# Patient Record
Sex: Female | Born: 1965 | Race: White | Hispanic: No | Smoking: Never smoker
Health system: Southern US, Community
[De-identification: ages and names within clinical notes are randomized; demographics above are authoritative.]

## PROBLEM LIST (undated history)

## (undated) DIAGNOSIS — K279 Peptic ulcer, site unspecified, unspecified as acute or chronic, without hemorrhage or perforation: Secondary | ICD-10-CM

## (undated) DIAGNOSIS — K311 Adult hypertrophic pyloric stenosis: Secondary | ICD-10-CM

## (undated) DIAGNOSIS — M1990 Unspecified osteoarthritis, unspecified site: Secondary | ICD-10-CM

## (undated) HISTORY — PX: CHOLECYSTECTOMY: SHX55

## (undated) HISTORY — PX: PORT A CATH REVISION: SHX6033

## (undated) HISTORY — PX: BREAST ENHANCEMENT SURGERY: SHX7

## (undated) HISTORY — PX: TUBAL LIGATION: SHX77

## (undated) HISTORY — DX: Unspecified osteoarthritis, unspecified site: M19.90

## (undated) HISTORY — PX: TONSILLECTOMY: SUR1361

## (undated) HISTORY — PX: PARTIAL HYSTERECTOMY: SHX80

## (undated) HISTORY — PX: MASTOIDECTOMY: SHX711

## (undated) HISTORY — DX: Peptic ulcer, site unspecified, unspecified as acute or chronic, without hemorrhage or perforation: K27.9

## (undated) HISTORY — DX: Adult hypertrophic pyloric stenosis: K31.1

## (undated) HISTORY — PX: TYMPANOSTOMY: SHX2586

---

## 2015-01-31 ENCOUNTER — Encounter (INDEPENDENT_AMBULATORY_CARE_PROVIDER_SITE_OTHER): Payer: Self-pay | Admitting: *Deleted

## 2015-02-27 ENCOUNTER — Ambulatory Visit (INDEPENDENT_AMBULATORY_CARE_PROVIDER_SITE_OTHER): Payer: Medicare Other | Admitting: Internal Medicine

## 2015-03-09 ENCOUNTER — Encounter (INDEPENDENT_AMBULATORY_CARE_PROVIDER_SITE_OTHER): Payer: Self-pay | Admitting: *Deleted

## 2015-03-09 ENCOUNTER — Ambulatory Visit (INDEPENDENT_AMBULATORY_CARE_PROVIDER_SITE_OTHER): Payer: Medicare Other | Admitting: Internal Medicine

## 2015-03-09 ENCOUNTER — Encounter (INDEPENDENT_AMBULATORY_CARE_PROVIDER_SITE_OTHER): Payer: Self-pay | Admitting: Internal Medicine

## 2015-03-09 VITALS — BP 112/72 | HR 64 | Temp 97.9°F | Ht 66.0 in | Wt 139.4 lb

## 2015-03-09 DIAGNOSIS — H8103 Meniere's disease, bilateral: Secondary | ICD-10-CM

## 2015-03-09 DIAGNOSIS — K5000 Crohn's disease of small intestine without complications: Secondary | ICD-10-CM | POA: Diagnosis not present

## 2015-03-09 DIAGNOSIS — I776 Arteritis, unspecified: Secondary | ICD-10-CM

## 2015-03-09 DIAGNOSIS — D509 Iron deficiency anemia, unspecified: Secondary | ICD-10-CM

## 2015-03-09 DIAGNOSIS — K5090 Crohn's disease, unspecified, without complications: Secondary | ICD-10-CM | POA: Insufficient documentation

## 2015-03-09 DIAGNOSIS — I951 Orthostatic hypotension: Secondary | ICD-10-CM | POA: Diagnosis not present

## 2015-03-09 DIAGNOSIS — H8109 Meniere's disease, unspecified ear: Secondary | ICD-10-CM | POA: Insufficient documentation

## 2015-03-09 DIAGNOSIS — M359 Systemic involvement of connective tissue, unspecified: Secondary | ICD-10-CM | POA: Insufficient documentation

## 2015-03-09 DIAGNOSIS — D649 Anemia, unspecified: Secondary | ICD-10-CM | POA: Insufficient documentation

## 2015-03-09 DIAGNOSIS — M1990 Unspecified osteoarthritis, unspecified site: Secondary | ICD-10-CM

## 2015-03-09 DIAGNOSIS — K3184 Gastroparesis: Secondary | ICD-10-CM

## 2015-03-09 LAB — CBC WITH DIFFERENTIAL/PLATELET
BASOS PCT: 0 % (ref 0–1)
Basophils Absolute: 0 10*3/uL (ref 0.0–0.1)
EOS ABS: 0.1 10*3/uL (ref 0.0–0.7)
EOS PCT: 1 % (ref 0–5)
HCT: 34 % — ABNORMAL LOW (ref 36.0–46.0)
Hemoglobin: 11.5 g/dL — ABNORMAL LOW (ref 12.0–15.0)
LYMPHS ABS: 2.6 10*3/uL (ref 0.7–4.0)
Lymphocytes Relative: 47 % — ABNORMAL HIGH (ref 12–46)
MCH: 30.1 pg (ref 26.0–34.0)
MCHC: 33.8 g/dL (ref 30.0–36.0)
MCV: 89 fL (ref 78.0–100.0)
MPV: 9.4 fL (ref 8.6–12.4)
Monocytes Absolute: 0.5 10*3/uL (ref 0.1–1.0)
Monocytes Relative: 9 % (ref 3–12)
NEUTROS PCT: 43 % (ref 43–77)
Neutro Abs: 2.4 10*3/uL (ref 1.7–7.7)
PLATELETS: 285 10*3/uL (ref 150–400)
RBC: 3.82 MIL/uL — ABNORMAL LOW (ref 3.87–5.11)
RDW: 13 % (ref 11.5–15.5)
WBC: 5.5 10*3/uL (ref 4.0–10.5)

## 2015-03-09 LAB — C-REACTIVE PROTEIN

## 2015-03-09 LAB — HEPATIC FUNCTION PANEL
ALK PHOS: 74 U/L (ref 33–115)
ALT: 7 U/L (ref 6–29)
AST: 13 U/L (ref 10–35)
Albumin: 3.7 g/dL (ref 3.6–5.1)
BILIRUBIN DIRECT: 0.1 mg/dL (ref <=0.2)
BILIRUBIN TOTAL: 0.4 mg/dL (ref 0.2–1.2)
Indirect Bilirubin: 0.3 mg/dL (ref 0.2–1.2)
Total Protein: 6.1 g/dL (ref 6.1–8.1)

## 2015-03-09 NOTE — Progress Notes (Signed)
Subjective:    Patient ID: Katherine Richards, female    DOB: 06-10-1965, 50 y.o.   MRN: [id number]  HPI Referred by Dr. Sherryll Burger for hx of Crohn's disease. She saw Dr. Sherryll Burger as a new patient 01/29/2015. She was diagnosed In 2008 with capsule endoscopy. She is taking Entocort 6-9mg  a day. She has taken Pentasa in the past. She averages 1-2 BMs a day. Sometimes she will have diarrhea when she has vertigo. When she has diarrhea, she says she passes a lot of "grease" She denies any BRRB or melena. Her last colonoscopy October 2016 in [place]. ( I am going to try to get these records) For the most part with her Crohn's she is doing well. She says her appetite is not good. She takes Reglan for gastroparesis.  She has a Port a cath for Meniere's and diarrhea. (IV fluids) She has home heath once a week to give her IV fluids. For the most part her acid reflux is controlled with Prevacid.  No side effects from the Reglan. She has been on Reglan since 2015.      Review of Systems Past Medical History  Diagnosis Date  . DJD (degenerative joint disease)   . Pyloric stenosis   . PUD (peptic ulcer disease)     Past Surgical History  Procedure Laterality Date  . Tonsillectomy    . Mastoidectomy    . Tympanostomy      x 6  . Breast enhancement surgery    . Port a cath revision      x 4  . Tubal ligation    . Partial hysterectomy    . Cholecystectomy      Allergies  Allergen Reactions  . Amoxicillin   . Augmentin [Amoxicillin-Pot Clavulanate]   . Ceclor [Cefaclor]   . Cefepime   . Flagyl [Metronidazole]     Swelling and hives  . Meperidine And Related     No current outpatient prescriptions on file prior to visit.   No current facility-administered medications on file prior to visit.   Current Outpatient Prescriptions  Medication Sig Dispense Refill  . acetaminophen (TYLENOL) 500 MG tablet Take 500 mg by mouth every 6 (six) hours as needed.    . budesonide (ENTOCORT EC) 3 MG 24  hr capsule Take 9 mg by mouth as needed.    . cholecalciferol (VITAMIN D) 1000 units tablet Take 6,000 Units by mouth daily.    . cyclobenzaprine (FLEXERIL) 10 MG tablet Take 10 mg by mouth 3 (three) times daily as needed for muscle spasms.    . diazepam (VALIUM) 10 MG tablet Take 10 mg by mouth every 6 (six) hours as needed for anxiety.    . diphenoxylate-atropine (LOMOTIL) 2.5-0.025 MG tablet Take by mouth 4 (four) times daily as needed for diarrhea or loose stools.    Marland Kitchen escitalopram (LEXAPRO) 20 MG tablet Take 20 mg by mouth daily.    . Ferrous Sulfate Dried (SLOW IRON PO) Take by mouth as needed.    . Homeopathic Products (ZICAM COLD REMEDY NA) Place into the nose as needed.    . hyoscyamine (LEVSIN, ANASPAZ) 0.125 MG tablet Take 0.125 mg by mouth every 4 (four) hours as needed.    . lansoprazole (PREVACID) 30 MG capsule Take 30 mg by mouth daily at 12 noon.    . Lido-Capsaicin-Men-Methyl Sal (MEDI-PATCH-LIDOCAINE) 0.5-0.035-5-20 % PTCH Apply topically.    Marland Kitchen loperamide (IMODIUM) 2 MG capsule Take by mouth as needed for diarrhea or  loose stools.    . metoCLOPramide (REGLAN) 5 MG tablet Take 5 mg by mouth as needed for nausea.    . midodrine (PROAMATINE) 2.5 MG tablet Take 2.5 mg by mouth 3 (three) times daily with meals.    . ondansetron (ZOFRAN) 8 MG tablet Take by mouth every 8 (eight) hours as needed for nausea or vomiting.    Marland Kitchen oxyCODONE-acetaminophen (ROXICET) 5-325 MG/5ML solution Take by mouth every 4 (four) hours as needed for severe pain.    . pindolol (VISKEN) 5 MG tablet Take 5 mg by mouth 2 (two) times daily.    . promethazine (PHENERGAN) 25 MG tablet Take 25 mg by mouth every 6 (six) hours as needed for nausea or vomiting.    . rizatriptan (MAXALT) 10 MG tablet Take 10 mg by mouth as needed for migraine. May repeat in 2 hours if needed    . zolpidem (AMBIEN) 10 MG tablet Take 10 mg by mouth at bedtime as needed for sleep.     No current facility-administered medications for this  visit.        Objective:   Physical Exam Blood pressure 112/72, pulse 64, temperature 97.9 F (36.6 C), height  (1.676 m), weight 139 lb 6.4 oz (63.231 kg). Alert and oriented. Skin warm and dry. Oral mucosa is moist.   . Sclera anicteric, conjunctivae is pink. Thyroid not enlarged. No cervical lymphadenopathy. Lungs clear. Heart regular rate and rhythm.  Abdomen is soft. Bowel sounds are positive. No hepatomegaly. No abdominal masses felt. No tenderness.  No edema to lower extremities.          Assessment & Plan:  Gastroparesis: She needs to take the Reglan 3 times a day. Eat small frequent meals. Crohn's disease. I need to get records before I start her on pentasa. Presently taking Entocort.  CBC, Hepatic function, CRP.  OV in 6 months.

## 2015-03-09 NOTE — Patient Instructions (Signed)
OV in 6 months. 

## 2015-03-19 ENCOUNTER — Encounter (INDEPENDENT_AMBULATORY_CARE_PROVIDER_SITE_OTHER): Payer: Self-pay | Admitting: *Deleted

## 2015-03-19 NOTE — Telephone Encounter (Signed)
This encounter was created in error - please disregard.

## 2015-03-22 ENCOUNTER — Ambulatory Visit (HOSPITAL_COMMUNITY): Payer: Medicare Other

## 2015-04-17 ENCOUNTER — Ambulatory Visit (HOSPITAL_COMMUNITY)
Admission: RE | Admit: 2015-04-17 | Discharge: 2015-04-17 | Disposition: A | Discharge status: Home / Self Care | Payer: Medicare Other | Source: Ambulatory Visit | Attending: Internal Medicine | Admitting: Internal Medicine

## 2015-04-17 DIAGNOSIS — K5000 Crohn's disease of small intestine without complications: Secondary | ICD-10-CM | POA: Diagnosis not present

## 2015-04-17 MED ORDER — HEPARIN SOD (PORK) LOCK FLUSH 100 UNIT/ML IV SOLN
INTRAVENOUS | Status: AC
Start: 2015-04-17 — End: 2015-04-18
  Filled 2015-04-17: qty 5

## 2015-04-17 MED ORDER — SODIUM CHLORIDE 0.9% FLUSH
INTRAVENOUS | Status: AC
Start: 2015-04-17 — End: 2015-04-18
  Filled 2015-04-17: qty 200

## 2015-04-17 MED ORDER — IOPAMIDOL (ISOVUE-300) INJECTION 61%
75.0000 mL | Freq: Once | INTRAVENOUS | Status: AC | PRN
  Administered 2015-04-17: 75 mL via INTRAVENOUS

## 2015-04-25 ENCOUNTER — Encounter (HOSPITAL_COMMUNITY)
Admission: RE | Admit: 2015-04-25 | Discharge: 2015-04-25 | Disposition: A | Discharge status: Home / Self Care | Payer: Medicare Other | Source: Ambulatory Visit | Attending: Internal Medicine | Admitting: Internal Medicine

## 2015-04-25 DIAGNOSIS — Z029 Encounter for administrative examinations, unspecified: Secondary | ICD-10-CM | POA: Diagnosis present

## 2015-04-25 MED ORDER — ALTEPLASE 100 MG IV SOLR
2.0000 mg | Freq: Once | INTRAVENOUS | Status: AC
  Administered 2015-04-25: 2 mg
  Filled 2015-04-25: qty 2

## 2015-04-25 MED ORDER — STERILE WATER FOR INJECTION IJ SOLN
10.0000 mL | Freq: Once | INTRAMUSCULAR | Status: AC
  Administered 2015-04-25: 10 mL via INTRAMUSCULAR

## 2015-06-04 ENCOUNTER — Encounter: Payer: Self-pay | Admitting: *Deleted

## 2015-06-04 ENCOUNTER — Encounter (INDEPENDENT_AMBULATORY_CARE_PROVIDER_SITE_OTHER): Payer: Self-pay

## 2015-06-06 ENCOUNTER — Ambulatory Visit: Payer: Medicare Other | Admitting: Cardiology

## 2015-06-14 ENCOUNTER — Encounter: Payer: Self-pay | Admitting: *Deleted

## 2015-06-15 ENCOUNTER — Encounter: Payer: Self-pay | Admitting: Cardiovascular Disease

## 2015-06-15 ENCOUNTER — Ambulatory Visit (INDEPENDENT_AMBULATORY_CARE_PROVIDER_SITE_OTHER): Payer: Medicare Other | Admitting: Cardiovascular Disease

## 2015-06-15 VITALS — BP 109/74 | HR 71 | Ht 66.0 in | Wt 141.0 lb

## 2015-06-15 DIAGNOSIS — R000 Tachycardia, unspecified: Secondary | ICD-10-CM | POA: Diagnosis not present

## 2015-06-15 DIAGNOSIS — I951 Orthostatic hypotension: Secondary | ICD-10-CM | POA: Diagnosis not present

## 2015-06-15 DIAGNOSIS — G90A Postural orthostatic tachycardia syndrome (POTS): Secondary | ICD-10-CM

## 2015-06-15 NOTE — Progress Notes (Signed)
Patient ID: Katherine Richards, female   DOB: 18-Mar-1965, 50 y.o.   MRN: [id number]       CARDIOLOGY CONSULT NOTE  Patient ID: Katherine Richards MRN: [id number] DOB/AGE: April 11, 1965 50 y.o.  Admit date: (Not on file) Primary Physician: Kirstie Peri, MD Referring Physician: Sherryll Burger MD  Reason for Consultation: Palpitations, POTS  HPI: The patient is a 50 year old woman with a history of Crohn's disease and GERD who is referred for the evaluation of palpitations in the setting of postural orthostatic tachycardia syndrome.  She and her boyfriend moved from [place], [place], to [place], [place], in December 2016. She was diagnosed with POTS in 2007.  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  She has previously been evaluated at [facility] and the [facility] in their dysautonomia clinic. She was diagnosed with low blood volume. She has bilateral Mnire's disease and sees a specialist at [facility]. She has bouts of aggressive vertigo. Because fludrocortisone is a sodium retainer, she is unable to take this as it makes her symptoms worse.  She has a Port-A-Cath and takes anywhere between 2-5 L of D5 half normal saline every week. Her symptoms are exacerbated when temperatures are greater than 75. She used to see an electrophysiologist in [place], [place]. Her systolic blood pressure ranges anywhere between 90-130.  She used to deal with ankle and feet edema but not recently. Has occasional feet cramps. She feels better when the air conditioning is on. Her mother and grandmother live in [place].  She has been hospitalized for sepsis on 4 different occasions, once with Klebsiella and at least twice with Escherichia coli. She was bedridden for several months.  She also has gastroparesis and also has an Field seismologist.   She currently takes pindolol 2.5 mg twice daily and midodrine 2.5 mg twice  daily.   She had been under a lot of stress as she was undergoing a divorce in the past 2 years. She has a home health nurse who comes in once a week and checks her Port-A-Cath.    Allergies  Allergen Reactions  . Other Anaphylaxis    DOWN FEATHERS  . Codeine Nausea And Vomiting  . Cymbalta [Duloxetine Hcl] Other (See Comments)    Suicidal ideation  . Erythromycin     Gastro-intestinal distress  . Flagyl [Metronidazole]     Swelling and hives  . Gabapentin Other (See Comments)    hallucinations  . Lyrica [Pregabalin] Other (See Comments)    Central edema  . Meperidine And Related   . Amoxicillin Rash  . Augmentin [Amoxicillin-Pot Clavulanate] Rash  . Ceclor [Cefaclor] Rash  . Cefepime Rash  . Demerol [Meperidine] Swelling and Rash  . Tape Rash    Current Outpatient Prescriptions  Medication Sig Dispense Refill  . acetaminophen (TYLENOL) 500 MG tablet Take 500 mg by mouth every 6 (six) hours as needed.    . budesonide (ENTOCORT EC) 3 MG 24 hr capsule Take 9 mg by mouth as needed.    . diazepam (VALIUM) 10 MG tablet Take 10 mg by mouth 2 (two) times daily.     . diphenoxylate-atropine (LOMOTIL) 2.5-0.025 MG tablet Take by mouth 4 (four) times daily as needed for diarrhea or loose stools.    Marland Kitchen EPINEPHrine (EPIPEN 2-PAK) 0.3 mg/0.3 mL IJ SOAJ injection Inject 0.3 mg into the muscle as needed.    Marland Kitchen escitalopram (LEXAPRO) 20 MG tablet Take 20  mg by mouth daily.    . Ferrous Sulfate Dried (SLOW IRON PO) Take by mouth as needed.    . hyoscyamine (LEVSIN, ANASPAZ) 0.125 MG tablet Take 0.125 mg by mouth 2 (two) times daily.     Marland Kitchen ibuprofen (ADVIL) 200 MG tablet Take 200 mg by mouth every 6 (six) hours as needed.    Marland Kitchen levocetirizine (XYZAL) 5 MG tablet Take 1 tablet by mouth daily as needed.    . loperamide (IMODIUM) 2 MG capsule Take by mouth as needed for diarrhea or loose stools.    . Melatonin ([employer] MELATONIN) 5 MG TABS Take 0.5-1 tablets by mouth daily.    . metoCLOPramide  (REGLAN) 5 MG tablet Take 10 mg by mouth every 6 (six) hours as needed for nausea.     . midodrine (PROAMATINE) 2.5 MG tablet Take 2.5 mg by mouth. 2-3 times daily    . omeprazole (PRILOSEC) 40 MG capsule Take 1 capsule by mouth daily.    . ondansetron (ZOFRAN) 8 MG tablet Take 8 mg by mouth every 8 (eight) hours as needed for nausea or vomiting.     . pindolol (VISKEN) 5 MG tablet Take 5 mg by mouth 2 (two) times daily.    . promethazine (PHENERGAN) 25 MG tablet Take 25 mg by mouth every 4 (four) hours as needed for nausea or vomiting.     Marland Kitchen zolpidem (AMBIEN) 10 MG tablet Take 5 mg by mouth at bedtime as needed for sleep.     . cholecalciferol (VITAMIN D) 1000 units tablet Take 6,000 Units by mouth daily.    . cyclobenzaprine (FLEXERIL) 10 MG tablet Take 10 mg by mouth 3 (three) times daily as needed for muscle spasms.    . Homeopathic Products (ZICAM COLD REMEDY NA) Place into the nose as needed.    . lansoprazole (PREVACID) 30 MG capsule Take 30 mg by mouth daily at 12 noon.    . Lido-Capsaicin-Men-Methyl Sal (MEDI-PATCH-LIDOCAINE) 0.5-0.035-5-20 % PTCH Apply topically.    . rizatriptan (MAXALT) 10 MG tablet Take 10 mg by mouth as needed for migraine. May repeat in 2 hours if needed     No current facility-administered medications for this visit.    Past Medical History  Diagnosis Date  . DJD (degenerative joint disease)   . Pyloric stenosis   . PUD (peptic ulcer disease)     Past Surgical History  Procedure Laterality Date  . Tonsillectomy    . Mastoidectomy    . Tympanostomy      x 6  . Breast enhancement surgery    . Port a cath revision      x 4  . Tubal ligation    . Partial hysterectomy    . Cholecystectomy      Social History   Social History  . Marital Status: Legally Separated    Spouse Name: N/A  . Number of Children: N/A  . Years of Education: N/A   Occupational History  . Not on file.   Social History Main Topics  . Smoking status: Never Smoker   .  Smokeless tobacco: Never Used  . Alcohol Use: No  . Drug Use: No  . Sexual Activity: Not on file   Other Topics Concern  . Not on file   Social History Narrative     No family history of premature CAD in 1st degree relatives.  Prior to Admission medications   Medication Sig Start Date End Date Taking? Authorizing Provider  acetaminophen (TYLENOL) 500  MG tablet Take 500 mg by mouth every 6 (six) hours as needed.    Historical Provider, MD  budesonide (ENTOCORT EC) 3 MG 24 hr capsule Take 9 mg by mouth as needed.    Historical Provider, MD  cholecalciferol (VITAMIN D) 1000 units tablet Take 6,000 Units by mouth daily.    Historical Provider, MD  cyclobenzaprine (FLEXERIL) 10 MG tablet Take 10 mg by mouth 3 (three) times daily as needed for muscle spasms.    Historical Provider, MD  diazepam (VALIUM) 10 MG tablet Take 10 mg by mouth every 6 (six) hours as needed for anxiety.    Historical Provider, MD  diphenoxylate-atropine (LOMOTIL) 2.5-0.025 MG tablet Take by mouth 4 (four) times daily as needed for diarrhea or loose stools.    Historical Provider, MD  escitalopram (LEXAPRO) 20 MG tablet Take 20 mg by mouth daily.    Historical Provider, MD  Ferrous Sulfate Dried (SLOW IRON PO) Take by mouth as needed.    Historical Provider, MD  Homeopathic Products [facility] COLD REMEDY NA) Place into the nose as needed.    Historical Provider, MD  hyoscyamine (LEVSIN, ANASPAZ) 0.125 MG tablet Take 0.125 mg by mouth every 4 (four) hours as needed.    Historical Provider, MD  lansoprazole (PREVACID) 30 MG capsule Take 30 mg by mouth daily at 12 noon.    Historical Provider, MD  Lido-Capsaicin-Men-Methyl Sal (MEDI-PATCH-LIDOCAINE) 0.5-0.035-5-20 % PTCH Apply topically.    Historical Provider, MD  loperamide (IMODIUM) 2 MG capsule Take by mouth as needed for diarrhea or loose stools.    Historical Provider, MD  metoCLOPramide (REGLAN) 5 MG tablet Take 5 mg by mouth as needed for nausea.    Historical  Provider, MD  midodrine (PROAMATINE) 2.5 MG tablet Take 2.5 mg by mouth 3 (three) times daily with meals.    Historical Provider, MD  ondansetron (ZOFRAN) 8 MG tablet Take by mouth every 8 (eight) hours as needed for nausea or vomiting.    Historical Provider, MD  oxyCODONE-acetaminophen (ROXICET) 5-325 MG/5ML solution Take by mouth every 4 (four) hours as needed for severe pain.    Historical Provider, MD  pindolol (VISKEN) 5 MG tablet Take 5 mg by mouth 2 (two) times daily.    Historical Provider, MD  promethazine (PHENERGAN) 25 MG tablet Take 25 mg by mouth every 6 (six) hours as needed for nausea or vomiting.    Historical Provider, MD  rizatriptan (MAXALT) 10 MG tablet Take 10 mg by mouth as needed for migraine. May repeat in 2 hours if needed    Historical Provider, MD  zolpidem (AMBIEN) 10 MG tablet Take 10 mg by mouth at bedtime as needed for sleep.    Historical Provider, MD     Review of systems complete and found to be negative unless listed above in HPI     Physical exam Blood pressure 109/74, pulse 71, height  (1.676 m), weight 141 lb (63.957 kg). General: NAD Neck: No JVD, no thyromegaly or thyroid nodule.  Lungs: Clear to auscultation bilaterally with normal respiratory effort. CV: Nondisplaced PMI. Regular rate and rhythm, normal S1/S2, no S3/S4, no murmur.  No peripheral edema.  No carotid bruit.  Normal pedal pulses.  Abdomen: Soft, nontender, no hepatosplenomegaly, no distention.  Skin: Intact without lesions or rashes.  Neurologic: Alert and oriented x 3.  Psych: Normal affect. Extremities: No clubbing or cyanosis.  HEENT: Normal.   ECG: Most recent ECG reviewed.  Labs:   Lab Results  Component  Value Date   WBC 5.5 03/09/2015   HGB 11.5* 03/09/2015   HCT 34.0* 03/09/2015   MCV 89.0 03/09/2015   PLT 285 03/09/2015   No results for input(s): NA, K, CL, CO2, BUN, CREATININE, CALCIUM, PROT, BILITOT, ALKPHOS, ALT, AST, GLUCOSE in the last 168  hours.  Invalid input(s): LABALBU No results found for: CKTOTAL, CKMB, CKMBINDEX, TROPONINI No results found for: CHOL No results found for: HDL No results found for: LDLCALC No results found for: TRIG No results found for: CHOLHDL No results found for: LDLDIRECT       Studies: No results found.  ASSESSMENT AND PLAN:  1. Postural orthostatic tachycardia syndrome: She is currently symptomatically stable. As stated above, she takes 2-5 L of D5 half normal saline every week. She currently takes 2.5 mg twice daily of midodrine and 2.5 mg twice daily of pindolol. She is symptomatically stable. I will make no adjustments to her medications at this time. We had a very extensive discussion regarding her past medical history and her multiple autoimmune diseases.   Dispo: fu 3 months.  Signed: Prentice Docker, M.D., F.A.C.C.  06/15/2015, 9:29 AM

## 2015-06-15 NOTE — Patient Instructions (Signed)
Your physician recommends that you schedule a follow-up appointment in: 3 months with Dr. Koneswaran  Your physician recommends that you continue on your current medications as directed. Please refer to the Current Medication list given to you today.  Thank you for choosing [facility]!    

## 2015-07-09 ENCOUNTER — Encounter (HOSPITAL_COMMUNITY)
Admission: RE | Admit: 2015-07-09 | Discharge: 2015-07-09 | Disposition: A | Discharge status: Home / Self Care | Payer: Medicare Other | Source: Ambulatory Visit | Attending: Internal Medicine | Admitting: Internal Medicine

## 2015-07-09 DIAGNOSIS — H8103 Meniere's disease, bilateral: Secondary | ICD-10-CM | POA: Diagnosis present

## 2015-07-09 DIAGNOSIS — M359 Systemic involvement of connective tissue, unspecified: Secondary | ICD-10-CM | POA: Diagnosis not present

## 2015-07-09 DIAGNOSIS — I776 Arteritis, unspecified: Secondary | ICD-10-CM | POA: Diagnosis not present

## 2015-07-09 DIAGNOSIS — K5000 Crohn's disease of small intestine without complications: Secondary | ICD-10-CM | POA: Insufficient documentation

## 2015-07-09 DIAGNOSIS — D509 Iron deficiency anemia, unspecified: Secondary | ICD-10-CM | POA: Insufficient documentation

## 2015-07-09 DIAGNOSIS — K3184 Gastroparesis: Secondary | ICD-10-CM | POA: Diagnosis not present

## 2015-07-09 DIAGNOSIS — I951 Orthostatic hypotension: Secondary | ICD-10-CM | POA: Diagnosis present

## 2015-07-09 MED ORDER — ALTEPLASE 2 MG IJ SOLR
INTRAMUSCULAR | Status: AC
  Filled 2015-07-09: qty 2

## 2015-07-09 MED ORDER — ALTEPLASE 2 MG IJ SOLR
2.0000 mg | Freq: Once | INTRAMUSCULAR | Status: AC
  Administered 2015-07-09: 2 mg

## 2015-07-09 MED ORDER — SODIUM CHLORIDE 0.9% FLUSH
INTRAVENOUS | Status: AC
Start: 2015-07-09 — End: 2015-07-09
  Filled 2015-07-09: qty 50

## 2015-07-09 MED ORDER — STERILE WATER FOR INJECTION IJ SOLN
INTRAMUSCULAR | Status: AC
  Filled 2015-07-09: qty 10

## 2015-07-09 NOTE — Progress Notes (Addendum)
Pt arrives to Short stay clinic with L subclavian portacath accessed. Cathflo instilled per protocol/order. Attempted to withdraw cathflo without success. Will attempt to withdraw again in  30 min.

## 2015-07-09 NOTE — Progress Notes (Signed)
Blood withdrawn from port after second installation of cathflo. Port withdraws blood well and flushes well. Pt will start coming to the clinic every 6-8 weeks for maintenance.

## 2015-07-31 ENCOUNTER — Emergency Department (HOSPITAL_COMMUNITY): Payer: Medicare Other

## 2015-07-31 ENCOUNTER — Emergency Department (HOSPITAL_COMMUNITY)
Admission: EM | Admit: 2015-07-31 | Discharge: 2015-07-31 | Disposition: A | Discharge status: Home / Self Care | Payer: Medicare Other | Attending: Emergency Medicine | Admitting: Emergency Medicine

## 2015-07-31 ENCOUNTER — Encounter (HOSPITAL_COMMUNITY): Payer: Self-pay | Admitting: Emergency Medicine

## 2015-07-31 DIAGNOSIS — R079 Chest pain, unspecified: Secondary | ICD-10-CM | POA: Insufficient documentation

## 2015-07-31 DIAGNOSIS — R109 Unspecified abdominal pain: Secondary | ICD-10-CM

## 2015-07-31 DIAGNOSIS — Z791 Long term (current) use of non-steroidal anti-inflammatories (NSAID): Secondary | ICD-10-CM | POA: Diagnosis not present

## 2015-07-31 DIAGNOSIS — R1013 Epigastric pain: Secondary | ICD-10-CM | POA: Diagnosis present

## 2015-07-31 DIAGNOSIS — R112 Nausea with vomiting, unspecified: Secondary | ICD-10-CM | POA: Diagnosis not present

## 2015-07-31 DIAGNOSIS — Z79899 Other long term (current) drug therapy: Secondary | ICD-10-CM | POA: Insufficient documentation

## 2015-07-31 LAB — COMPREHENSIVE METABOLIC PANEL
ALBUMIN: 3.8 g/dL (ref 3.5–5.0)
ALK PHOS: 134 U/L — AB (ref 38–126)
ALT: 47 U/L (ref 14–54)
AST: 136 U/L — AB (ref 15–41)
Anion gap: 4 — ABNORMAL LOW (ref 5–15)
BILIRUBIN TOTAL: 0.8 mg/dL (ref 0.3–1.2)
BUN: 10 mg/dL (ref 6–20)
CO2: 24 mmol/L (ref 22–32)
Calcium: 8.1 mg/dL — ABNORMAL LOW (ref 8.9–10.3)
Chloride: 105 mmol/L (ref 101–111)
Creatinine, Ser: 0.62 mg/dL (ref 0.44–1.00)
GFR calc Af Amer: >60 mL/min (ref >60)
GFR calc non Af Amer: >60 mL/min (ref >60)
GLUCOSE: 105 mg/dL — AB (ref 65–99)
POTASSIUM: 3.3 mmol/L — AB (ref 3.5–5.1)
SODIUM: 133 mmol/L — AB (ref 135–145)
TOTAL PROTEIN: 6.6 g/dL (ref 6.5–8.1)

## 2015-07-31 LAB — URINALYSIS, ROUTINE W REFLEX MICROSCOPIC
BILIRUBIN URINE: NEGATIVE
Glucose, UA: NEGATIVE mg/dL
Hgb urine dipstick: NEGATIVE
Ketones, ur: NEGATIVE mg/dL
Leukocytes, UA: NEGATIVE
NITRITE: NEGATIVE
PH: 7 (ref 5.0–8.0)
Protein, ur: NEGATIVE mg/dL

## 2015-07-31 LAB — CBC WITH DIFFERENTIAL/PLATELET
BASOS ABS: 0 10*3/uL (ref 0.0–0.1)
BASOS PCT: 0 %
EOS ABS: 0.1 10*3/uL (ref 0.0–0.7)
Eosinophils Relative: 1 %
HEMATOCRIT: 35.1 % — AB (ref 36.0–46.0)
HEMOGLOBIN: 12 g/dL (ref 12.0–15.0)
Lymphocytes Relative: 26 %
Lymphs Abs: 2.6 10*3/uL (ref 0.7–4.0)
MCH: 30.6 pg (ref 26.0–34.0)
MCHC: 34.2 g/dL (ref 30.0–36.0)
MCV: 89.5 fL (ref 78.0–100.0)
Monocytes Absolute: 0.4 10*3/uL (ref 0.1–1.0)
Monocytes Relative: 4 %
NEUTROS ABS: 6.8 10*3/uL (ref 1.7–7.7)
NEUTROS PCT: 69 %
Platelets: 296 10*3/uL (ref 150–400)
RBC: 3.92 MIL/uL (ref 3.87–5.11)
RDW: 12.6 % (ref 11.5–15.5)
WBC: 9.9 10*3/uL (ref 4.0–10.5)

## 2015-07-31 LAB — TROPONIN I: Troponin I: <0.03 ng/mL (ref <0.03)

## 2015-07-31 LAB — LIPASE, BLOOD: Lipase: 35 U/L (ref 11–51)

## 2015-07-31 MED ORDER — MORPHINE SULFATE (PF) 4 MG/ML IV SOLN
4.0000 mg | Freq: Once | INTRAVENOUS | Status: AC
  Administered 2015-07-31: 4 mg via INTRAVENOUS
  Filled 2015-07-31: qty 1

## 2015-07-31 MED ORDER — ONDANSETRON 8 MG PO TBDP: 8.0000 mg | ORAL_TABLET | Freq: Three times a day (TID) | ORAL | Status: AC | PRN

## 2015-07-31 MED ORDER — SODIUM CHLORIDE 0.9 % IV BOLUS (SEPSIS)
2000.0000 mL | Freq: Once | INTRAVENOUS | Status: AC
  Administered 2015-07-31: 2000 mL via INTRAVENOUS

## 2015-07-31 MED ORDER — HYDROMORPHONE HCL 1 MG/ML IJ SOLN
1.0000 mg | INTRAMUSCULAR | Status: DC | PRN
  Administered 2015-07-31: 1 mg via INTRAVENOUS
  Filled 2015-07-31: qty 1

## 2015-07-31 MED ORDER — DIATRIZOATE MEGLUMINE & SODIUM 66-10 % PO SOLN
ORAL | Status: AC
  Filled 2015-07-31: qty 30

## 2015-07-31 MED ORDER — ONDANSETRON HCL 4 MG/2ML IJ SOLN
INTRAMUSCULAR | Status: AC
  Filled 2015-07-31: qty 2

## 2015-07-31 MED ORDER — IOPAMIDOL (ISOVUE-300) INJECTION 61%
100.0000 mL | Freq: Once | INTRAVENOUS | Status: AC | PRN
  Administered 2015-07-31: 100 mL via INTRAVENOUS

## 2015-07-31 MED ORDER — ONDANSETRON HCL 4 MG/2ML IJ SOLN
4.0000 mg | Freq: Once | INTRAMUSCULAR | Status: AC
  Administered 2015-07-31: 4 mg via INTRAVENOUS

## 2015-07-31 MED ORDER — LORAZEPAM 2 MG/ML IJ SOLN
1.0000 mg | Freq: Once | INTRAMUSCULAR | Status: AC
  Administered 2015-07-31: 1 mg via INTRAVENOUS
  Filled 2015-07-31: qty 1

## 2015-07-31 MED ORDER — PROMETHAZINE HCL 25 MG PO TABS: 25.0000 mg | ORAL_TABLET | Freq: Four times a day (QID) | ORAL | Status: AC | PRN

## 2015-07-31 MED ORDER — HEPARIN SOD (PORK) LOCK FLUSH 100 UNIT/ML IV SOLN
INTRAVENOUS | Status: AC
  Filled 2015-07-31: qty 5

## 2015-07-31 MED ORDER — PROMETHAZINE HCL 25 MG/ML IJ SOLN
12.5000 mg | Freq: Once | INTRAMUSCULAR | Status: AC
  Administered 2015-07-31: 12.5 mg via INTRAVENOUS
  Filled 2015-07-31: qty 1

## 2015-07-31 MED ORDER — METOCLOPRAMIDE HCL 5 MG/ML IJ SOLN
10.0000 mg | Freq: Once | INTRAMUSCULAR | Status: AC
  Administered 2015-07-31: 10 mg via INTRAVENOUS

## 2015-07-31 MED ORDER — SODIUM CHLORIDE 0.9 % IV BOLUS (SEPSIS): 1000.0000 mL | Freq: Once | INTRAVENOUS | Status: DC

## 2015-07-31 MED ORDER — METOCLOPRAMIDE HCL 5 MG/ML IJ SOLN
INTRAMUSCULAR | Status: AC
  Filled 2015-07-31: qty 2

## 2015-07-31 NOTE — ED Notes (Signed)
Pt c/o sob and chest pain.

## 2015-07-31 NOTE — ED Notes (Signed)
Per pt, port to stay accessed and runs fluids at home and flushed with heparin nightly. Pt reports home health nurse comes out every Thursday and changes port needle.   Port flushed with 5 ml of heparin at discharge per protocol.

## 2015-07-31 NOTE — ED Notes (Signed)
Pt kept previous crackers down. Pt given another pack of peanut butter crackers and ginger ale. Pt tolerating well.   Pt requesting to see EDP. EDP aware.

## 2015-07-31 NOTE — ED Notes (Signed)
Pt given crackers at this time.

## 2015-07-31 NOTE — ED Provider Notes (Addendum)
CSN: [id number]     Arrival date & time 07/31/15  0112 History   First MD Initiated Contact with Patient 07/31/15 0130     Chief Complaint  Patient presents with  . Chest Pain      The history is provided by the patient.  Patient presents to dementia primarily with complaints epigastric and upper abdominal discomfort as well as low chest discomfort. She reports nausea and vomiting but denies diarrhea. No fevers or chills. Denies dysuria or urinary frequency. Her upper abdominal discomfort is sharp in nature. She initially told nursing staff that she was having chest pain but reports this is more localized to the upper abdomen. Denies arm or shoulder pain. No upper back pain. Denies radiation of discomfort into her neck or back. She reports since the pain began in her upper abdominal pains been constant and severe. She speaks in a very low level at this time because she states that when she takes a deep breath it makes her upper abdominal pain worse. Her pain is severe in severity. She has a port in her left chest     Past Medical History  Diagnosis Date  . DJD (degenerative joint disease)   . Pyloric stenosis   . PUD (peptic ulcer disease)    Past Surgical History  Procedure Laterality Date  . Tonsillectomy    . Mastoidectomy    . Tympanostomy      x 6  . Breast enhancement surgery    . Port a cath revision      x 4  . Tubal ligation    . Partial hysterectomy    . Cholecystectomy     History reviewed. No pertinent family history. Social History  Substance Use Topics  . Smoking status: Never Smoker   . Smokeless tobacco: Never Used  . Alcohol Use: No   OB History    No data available     Review of Systems  All other systems negative except as per history of present illness    Allergies  Other; Codeine; Cymbalta; Erythromycin; Flagyl; Gabapentin; Lyrica; Meperidine and related; Amoxicillin; Augmentin; Ceclor; Cefepime; Demerol; and Tape  Home Medications   Prior  to Admission medications   Medication Sig Start Date End Date Taking? Authorizing Provider  acetaminophen (TYLENOL) 500 MG tablet Take 500 mg by mouth every 6 (six) hours as needed.    Historical Provider, MD  budesonide (ENTOCORT EC) 3 MG 24 hr capsule Take 9 mg by mouth as needed.    Historical Provider, MD  cholecalciferol (VITAMIN D) 1000 units tablet Take 6,000 Units by mouth daily.    Historical Provider, MD  cyclobenzaprine (FLEXERIL) 10 MG tablet Take 10 mg by mouth 3 (three) times daily as needed for muscle spasms.    Historical Provider, MD  diazepam (VALIUM) 10 MG tablet Take 10 mg by mouth 2 (two) times daily.     Historical Provider, MD  diphenoxylate-atropine (LOMOTIL) 2.5-0.025 MG tablet Take by mouth 4 (four) times daily as needed for diarrhea or loose stools.    Historical Provider, MD  EPINEPHrine (EPIPEN 2-PAK) 0.3 mg/0.3 mL IJ SOAJ injection Inject 0.3 mg into the muscle as needed.    Historical Provider, MD  escitalopram (LEXAPRO) 20 MG tablet Take 20 mg by mouth daily.    Historical Provider, MD  Ferrous Sulfate Dried (SLOW IRON PO) Take by mouth as needed.    Historical Provider, MD  Homeopathic Products [facility] COLD REMEDY NA) Place into the nose as needed.  Historical Provider, MD  hyoscyamine (LEVSIN, ANASPAZ) 0.125 MG tablet Take 0.125 mg by mouth 2 (two) times daily.     Historical Provider, MD  ibuprofen (ADVIL) 200 MG tablet Take 200 mg by mouth every 6 (six) hours as needed.    Historical Provider, MD  lansoprazole (PREVACID) 30 MG capsule Take 30 mg by mouth daily at 12 noon.    Historical Provider, MD  levocetirizine (XYZAL) 5 MG tablet Take 1 tablet by mouth daily as needed. 05/23/15   Historical Provider, MD  Lido-Capsaicin-Men-Methyl Sal (MEDI-PATCH-LIDOCAINE) 0.5-0.035-5-20 % PTCH Apply topically.    Historical Provider, MD  loperamide (IMODIUM) 2 MG capsule Take by mouth as needed for diarrhea or loose stools.    Historical Provider, MD  Melatonin ([facility]  MELATONIN) 5 MG TABS Take 0.5-1 tablets by mouth daily.    Historical Provider, MD  metoCLOPramide (REGLAN) 5 MG tablet Take 10 mg by mouth every 6 (six) hours as needed for nausea.     Historical Provider, MD  midodrine (PROAMATINE) 2.5 MG tablet Take 2.5 mg by mouth. 2-3 times daily    Historical Provider, MD  omeprazole (PRILOSEC) 40 MG capsule Take 1 capsule by mouth daily. 05/23/15   Historical Provider, MD  ondansetron (ZOFRAN) 8 MG tablet Take 8 mg by mouth every 8 (eight) hours as needed for nausea or vomiting.     Historical Provider, MD  pindolol (VISKEN) 5 MG tablet Take 5 mg by mouth 2 (two) times daily.    Historical Provider, MD  promethazine (PHENERGAN) 25 MG tablet Take 25 mg by mouth every 4 (four) hours as needed for nausea or vomiting.     Historical Provider, MD  rizatriptan (MAXALT) 10 MG tablet Take 10 mg by mouth as needed for migraine. May repeat in 2 hours if needed    Historical Provider, MD  zolpidem (AMBIEN) 10 MG tablet Take 5 mg by mouth at bedtime as needed for sleep.     Historical Provider, MD   BP 109/57 mmHg  Pulse 88  Temp(Src) 97.6 F (36.4 C)  Resp 9  Ht 5\' 5"  (1.651 m)  Wt 140 lb (63.504 kg)  BMI 23.30 kg/m2  SpO2 96% Physical Exam  Constitutional: She is oriented to person, place, and time. She appears well-developed and well-nourished.  Uncomfortable appearing  HENT:  Head: Normocephalic and atraumatic.  Eyes: EOM are normal.  Neck: Normal range of motion.  Cardiovascular: Normal rate, regular rhythm and normal heart sounds.   Pulmonary/Chest: Effort normal and breath sounds normal.  Port in left chest without tenderness or surrounding erythema  Abdominal: Soft. She exhibits no distension.  Mild epigastric tenderness without guarding or rebound.  Musculoskeletal: Normal range of motion.  Neurological: She is alert and oriented to person, place, and time.  Skin: Skin is warm and dry.  Psychiatric: She has a normal mood and affect. Judgment  normal.  Nursing note and vitals reviewed.   ED Course  Procedures (including critical care time) Labs Review Labs Reviewed  CBC WITH DIFFERENTIAL/PLATELET - Abnormal; Notable for the following:    HCT 35.1 (*)    All other components within normal limits  COMPREHENSIVE METABOLIC PANEL - Abnormal; Notable for the following:    Sodium 133 (*)    Potassium 3.3 (*)    Glucose, Bld 105 (*)    Calcium 8.1 (*)    AST 136 (*)    Alkaline Phosphatase 134 (*)    Anion gap 4 (*)    All other  components within normal limits  URINALYSIS, ROUTINE W REFLEX MICROSCOPIC (NOT AT [facility]) - [facility]; Notable for the following:    Specific Gravity, Urine <1.005 (*)    All other components within normal limits  TROPONIN I  LIPASE, BLOOD    Imaging Review Ct Abdomen Pelvis W Contrast  07/31/2015  CLINICAL DATA:  Upper abdominal pain. History of Crohn's and gastroparesis. EXAM: CT ABDOMEN AND PELVIS WITH CONTRAST TECHNIQUE: Multidetector CT imaging of the abdomen and pelvis was performed using the standard protocol following bolus administration of intravenous contrast. CONTRAST:  ISOVUE-300 IOPAMIDOL (ISOVUE-300) INJECTION 61% COMPARISON:  04/17/2015 FINDINGS: Lower chest and abdominal wall: Bilateral breast implant, negative where visualized. Porta catheter tip seen at the distal SVC. Hepatobiliary: No focal liver abnormality.Cholecystectomy mild intrahepatic biliary dilatation, often reservoir effect. Pancreas: Unremarkable. Spleen: Unremarkable. Adrenals/Urinary Tract: Negative adrenals. No hydronephrosis or stone. Unremarkable bladder. Stomach/Bowel:  No obstruction. Normal appendix. Reproductive:Hysterectomy. Two 1 cm cysts/follicles in the right ovary, incidental at this size. Vascular/Lymphatic: No acute vascular abnormality. No mass or adenopathy. Other: No ascites or pneumoperitoneum. Musculoskeletal: No acute abnormalities. IMPRESSION: 1. No acute finding. 2. Mild increase in intrahepatic bile  duct diameter compared to 04/17/2015. This appearance may be related to cholecystectomy; correlate with liver function tests. Electronically Signed   By: Marnee Spring M.D.   On: 07/31/2015 04:55   Dg Chest Portable 1 View  07/31/2015  CLINICAL DATA:  Chest pain EXAM: PORTABLE CHEST 1 VIEW COMPARISON:  None. FINDINGS: Porta catheter on the left with tip at the distal SVC. Normal heart size and mediastinal contours. No acute infiltrate or edema. No effusion or pneumothorax. No acute osseous findings. Cholecystectomy clips. IMPRESSION: No evidence of acute cardiopulmonary disease. Electronically Signed   By: Marnee Spring M.D.   On: 07/31/2015 02:11   I have personally reviewed and evaluated these images and lab results as part of my medical decision-making.   EKG Interpretation   Date/Time:  Tuesday July 31 2015 01:25:22 EDT Ventricular Rate:  68 PR Interval:    QRS Duration: 99 QT Interval:  438 QTC Calculation: 466 R Axis:   48 Text Interpretation:  Sinus rhythm Baseline wander in lead(s) I II aVR V1  No old tracing to compare Confirmed by Avarie Tavano  MD, Shaelynn Dragos ([id number]) on  07/31/2015 1:56:24 AM      MDM   Final diagnoses:  Abdominal pain, unspecified abdominal location  Nausea and vomiting, vomiting of unspecified type    Patient was in the emergency department for a prolonged amount of time. Her symptoms usually very aggressively and she began to feel much better in the emergency department. Doubt PE. Doubt ACS. EKG without ischemic changes P chest x-ray without abnormality. CT abdomen and pelvis without pathology. Mild nonspecific elevation of her LFTs. She is status post cholecystectomy. Doubt retained common bile duct stone. Given her significant improvement in pain I don't believe she needs additional testing or admssion at this time and instead can follow up with outpatient GI. She may benefit from endoscopy in the future. She'll need to have her liver function tests rechecked in  the next several days, which she understands. She is instructed to return to the ER for new or worsening symptoms    Azalia Bilis, MD 08/01/15 0800  Azalia Bilis, MD 08/01/15 [phone]

## 2015-07-31 NOTE — Discharge Instructions (Signed)

## 2015-07-31 NOTE — ED Notes (Signed)
Pt has port to left chest, was accessed PTA. Pt reports she gets it accessed q week and then line changed q week.

## 2015-08-07 ENCOUNTER — Encounter (INDEPENDENT_AMBULATORY_CARE_PROVIDER_SITE_OTHER): Payer: Self-pay | Admitting: Internal Medicine

## 2015-08-07 ENCOUNTER — Ambulatory Visit (INDEPENDENT_AMBULATORY_CARE_PROVIDER_SITE_OTHER): Payer: Medicare Other | Admitting: Internal Medicine

## 2015-08-07 VITALS — BP 118/72 | HR 80 | Temp 98.3°F | Ht 66.0 in | Wt 140.5 lb

## 2015-08-07 DIAGNOSIS — R748 Abnormal levels of other serum enzymes: Secondary | ICD-10-CM

## 2015-08-07 DIAGNOSIS — R1013 Epigastric pain: Secondary | ICD-10-CM | POA: Diagnosis not present

## 2015-08-07 LAB — HEPATIC FUNCTION PANEL
ALT: 17 U/L (ref 6–29)
AST: 11 U/L (ref 10–35)
Albumin: 4 g/dL (ref 3.6–5.1)
Alkaline Phosphatase: 127 U/L — ABNORMAL HIGH (ref 33–115)
BILIRUBIN DIRECT: 0.1 mg/dL (ref <=0.2)
BILIRUBIN INDIRECT: 0.4 mg/dL (ref 0.2–1.2)
BILIRUBIN TOTAL: 0.5 mg/dL (ref 0.2–1.2)
Total Protein: 6.8 g/dL (ref 6.1–8.1)

## 2015-08-07 NOTE — Progress Notes (Addendum)
Subjective:    Patient ID: Katherine Richards, female    DOB: 1965-04-13, 50 y.o.   MRN: [id number]  HPIHere today for f/u after recent visit to the ED this month for epigastric pain. The onset was quick and lasted for approximately a couple of hours until she received pain medication. The pain was in her epigatric region. She underwent CT abdomen/pelvis   IMPRESSION: 1. No acute finding. 2. Mild increase in intrahepatic bile duct diameter compared to 04/17/2015. This appearance may be related to cholecystectomy; correlate with liver function tests.  Hx of cholecystectomy in April of 2015.  She says she has had this type of pain x 6 since her gallbladder removed. She had sludge in gallbladder. Cholecystectomy while in [place].  She says today she feels tired. She feels like she has the flu. She has some vomiting and diarrhea. She says she feels like she had a fever during the episode. When she has the pain it will radiate into her rt shoulder. When she has the pain, she has SOB  Hx of Meniere's and receives IV fluids about 2 times a week for orthostatic tachycardia, hypotension. Hx of Crohn's disease.  She has  2 BM a day and are greasy.   Her last colonoscopy October 2016 in [place].  ( Do not have results).   CBC    Component Value Date/Time   WBC 9.9 07/31/2015 0146   RBC 3.92 07/31/2015 0146   HGB 12.0 07/31/2015 0146   HCT 35.1 (L) 07/31/2015 0146   PLT 296 07/31/2015 0146   MCV 89.5 07/31/2015 0146   MCH 30.6 07/31/2015 0146   MCHC 34.2 07/31/2015 0146   RDW 12.6 07/31/2015 0146   LYMPHSABS 2.6 07/31/2015 0146   MONOABS 0.4 07/31/2015 0146   EOSABS 0.1 07/31/2015 0146   BASOSABS 0.0 07/31/2015 0146   CMP Latest Ref Rng & Units 07/31/2015 03/09/2015  Glucose 65 - 99 mg/dL 578(I) -  BUN 6 - 20 mg/dL 10 -  Creatinine 6.96 - 1.00 mg/dL 2.95 -  Sodium 284 - 132 mmol/L 133(L) -  Potassium 3.5 - 5.1 mmol/L 3.3(L) -  Chloride 101 - 111 mmol/L 105 -  CO2 22 - 32 mmol/L  24 -  Calcium 8.9 - 10.3 mg/dL 8.1(L) -  Total Protein 6.5 - 8.1 g/dL 6.6 6.1  Total Bilirubin 0.3 - 1.2 mg/dL 0.8 0.4  Alkaline Phos 38 - 126 U/L 134(H) 74  AST 15 - 41 U/L 136(H) 13  ALT 14 - 54 U/L 47 7                  Review of Systems Past Medical History:  Diagnosis Date  . DJD (degenerative joint disease)   . PUD (peptic ulcer disease)   . Pyloric stenosis     Past Surgical History:  Procedure Laterality Date  . BREAST ENHANCEMENT SURGERY    . CHOLECYSTECTOMY    . MASTOIDECTOMY    . PARTIAL HYSTERECTOMY    . PORT A CATH REVISION     x 4  . TONSILLECTOMY    . TUBAL LIGATION    . TYMPANOSTOMY     x 6    Allergies  Allergen Reactions  . Other Anaphylaxis    DOWN FEATHERS  . Codeine Nausea And Vomiting  . Cymbalta [Duloxetine Hcl] Other (See Comments)    Suicidal ideation  . Erythromycin     Gastro-intestinal distress  . Flagyl [Metronidazole]     Swelling and hives  .  Gabapentin Other (See Comments)    hallucinations  . Lyrica [Pregabalin] Other (See Comments)    Central edema  . Meperidine And Related   . Amoxicillin Rash  . Augmentin [Amoxicillin-Pot Clavulanate] Rash  . Ceclor [Cefaclor] Rash  . Cefepime Rash  . Demerol [Meperidine] Swelling and Rash  . Tape Rash    Current Outpatient Prescriptions on File Prior to Visit  Medication Sig Dispense Refill  . acetaminophen (TYLENOL) 500 MG tablet Take 500 mg by mouth every 6 (six) hours as needed.    . budesonide (ENTOCORT EC) 3 MG 24 hr capsule Take 9 mg by mouth as needed.    . cyclobenzaprine (FLEXERIL) 10 MG tablet Take 10 mg by mouth 3 (three) times daily as needed for muscle spasms.    . diazepam (VALIUM) 10 MG tablet Take 10 mg by mouth 2 (two) times daily.     . diphenoxylate-atropine (LOMOTIL) 2.5-0.025 MG tablet Take by mouth 4 (four) times daily as needed for diarrhea or loose stools.    Marland Kitchen EPINEPHrine (EPIPEN 2-PAK) 0.3 mg/0.3 mL IJ SOAJ injection Inject 0.3 mg into the muscle as  needed.    Marland Kitchen escitalopram (LEXAPRO) 20 MG tablet Take 20 mg by mouth daily.    . Ferrous Sulfate Dried (SLOW IRON PO) Take by mouth as needed.    . Homeopathic Products (ZICAM COLD REMEDY NA) Place into the nose as needed.    . hyoscyamine (LEVSIN, ANASPAZ) 0.125 MG tablet Take 0.125 mg by mouth as needed.     Marland Kitchen ibuprofen (ADVIL) 200 MG tablet Take 200 mg by mouth every 6 (six) hours as needed.    Marland Kitchen levocetirizine (XYZAL) 5 MG tablet Take 1 tablet by mouth daily as needed.    . Lido-Capsaicin-Men-Methyl Sal (MEDI-PATCH-LIDOCAINE) 0.5-0.035-5-20 % PTCH Apply topically.    Marland Kitchen loperamide (IMODIUM) 2 MG capsule Take by mouth as needed for diarrhea or loose stools.    . Melatonin ([facility] MELATONIN) 5 MG TABS Take 0.5-1 tablets by mouth daily.    . metoCLOPramide (REGLAN) 5 MG tablet Take 10 mg by mouth every 6 (six) hours as needed for nausea.     . midodrine (PROAMATINE) 2.5 MG tablet Take 2.5 mg by mouth. 2-3 times daily    . omeprazole (PRILOSEC) 40 MG capsule Take 1 capsule by mouth daily.    . ondansetron (ZOFRAN ODT) 8 MG disintegrating tablet Take 1 tablet (8 mg total) by mouth every 8 (eight) hours as needed for nausea or vomiting. 10 tablet 0  . ondansetron (ZOFRAN) 8 MG tablet Take 8 mg by mouth every 8 (eight) hours as needed for nausea or vomiting.     . pindolol (VISKEN) 5 MG tablet Take 5 mg by mouth 2 (two) times daily.    . promethazine (PHENERGAN) 25 MG tablet Take 1 tablet (25 mg total) by mouth every 6 (six) hours as needed for nausea or vomiting. 12 tablet 0  . rizatriptan (MAXALT) 10 MG tablet Take 10 mg by mouth as needed for migraine. May repeat in 2 hours if needed    . zolpidem (AMBIEN) 10 MG tablet Take 5 mg by mouth at bedtime as needed for sleep.     . cholecalciferol (VITAMIN D) 1000 units tablet Take 6,000 Units by mouth daily.    . lansoprazole (PREVACID) 30 MG capsule Take 30 mg by mouth daily at 12 noon.     No current facility-administered medications on file prior to  visit.  Objective:   Physical Exam Blood pressure 118/72, pulse 80, temperature 98.3 F (36.8 C), height  (1.676 m), weight 140 lb 8 oz (63.7 kg). Alert and oriented. Skin warm and dry. Oral mucosa is moist.   . Sclera anicteric, conjunctivae is pink. Thyroid not enlarged. No cervical lymphadenopathy. Lungs clear. Heart regular rate and rhythm.  Abdomen is soft. Bowel sounds are positive. No hepatomegaly. No abdominal masses felt. No tenderness.  No edema to lower extremities.          Assessment & Plan:  Epigastric pain with elevated liver enzymes. Pain has resolved.  Am going to repeat Hepatic function. Further recommendation to follow. If pain reoccurs, she will go to the ED.

## 2015-08-07 NOTE — Patient Instructions (Signed)
Labs today. If pain returns go to the ED.

## 2015-08-08 LAB — SEDIMENTATION RATE: SED RATE: 12 mm/h (ref 0–20)

## 2015-08-14 ENCOUNTER — Other Ambulatory Visit (INDEPENDENT_AMBULATORY_CARE_PROVIDER_SITE_OTHER): Payer: Self-pay | Admitting: Internal Medicine

## 2015-08-20 NOTE — Telephone Encounter (Signed)
error 

## 2015-09-25 ENCOUNTER — Ambulatory Visit: Payer: Medicare Other | Admitting: Cardiovascular Disease

## 2016-02-07 ENCOUNTER — Encounter (INDEPENDENT_AMBULATORY_CARE_PROVIDER_SITE_OTHER): Payer: Self-pay | Admitting: Internal Medicine

## 2016-02-07 ENCOUNTER — Ambulatory Visit (INDEPENDENT_AMBULATORY_CARE_PROVIDER_SITE_OTHER): Payer: Medicare Other | Admitting: Internal Medicine

## 2017-02-19 IMAGING — CT CT ABD-PELV W/ CM
2 of 5 series · 16 of 46 positions shown, 18 images · IV contrast (iopamidol)
Comparison: 04/17/2015

CLINICAL DATA: Upper abdominal pain. History of Crohn's and
gastroparesis.

EXAM:
CT ABDOMEN AND PELVIS WITH CONTRAST
TECHNIQUE: Multidetector CT imaging of the abdomen and pelvis was performed
using the standard protocol following bolus administration of
intravenous contrast.
CONTRAST:  100mL WDZJSF-3CC IOPAMIDOL (WDZJSF-3CC) INJECTION 61%

[Series 2: routine abd pel with · axial · 0.64mm/px · z∈[-461,-41]mm · 13 of 96 slices shown, 15 images]
[im 6/96  soft-tissue]
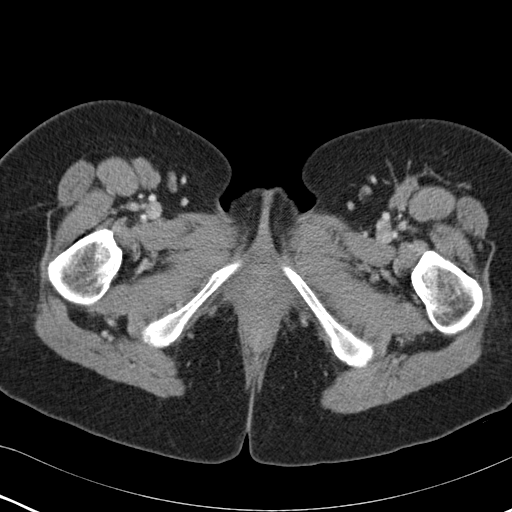
[im 6/96  bone]
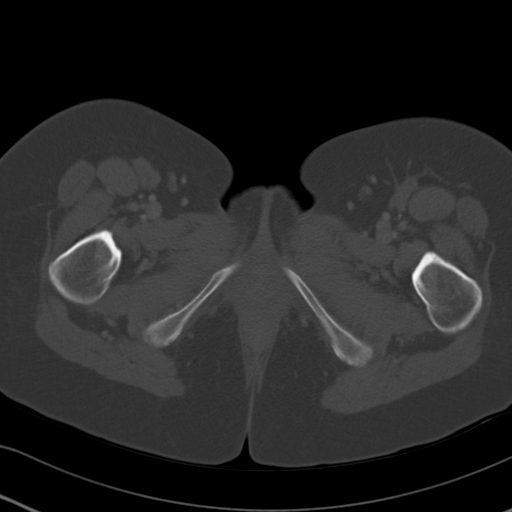
[im 12/96  soft-tissue]
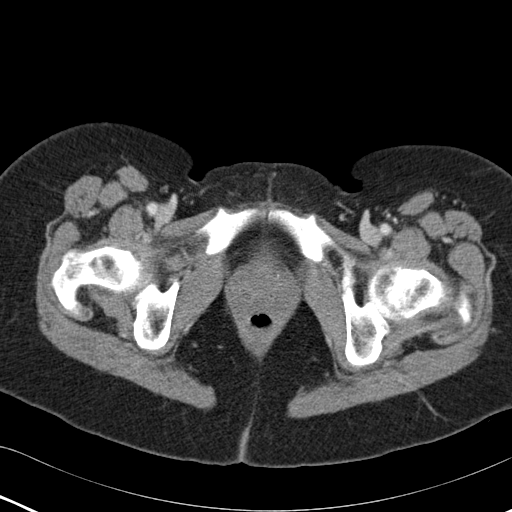
[im 23/96  soft-tissue]
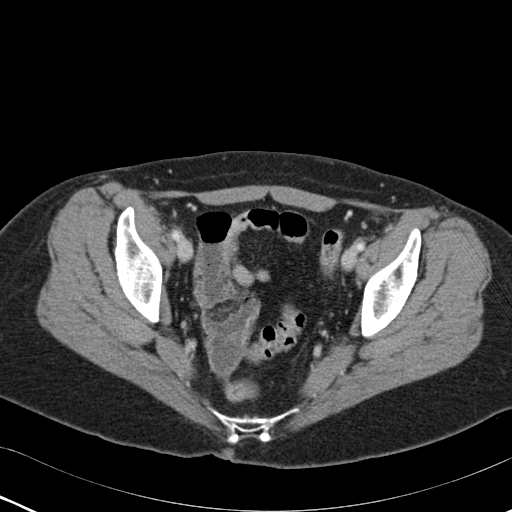
[im 28/96  soft-tissue]
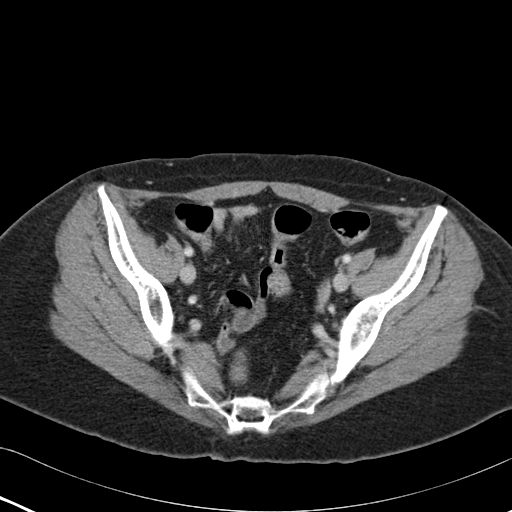
[im 34/96  soft-tissue]
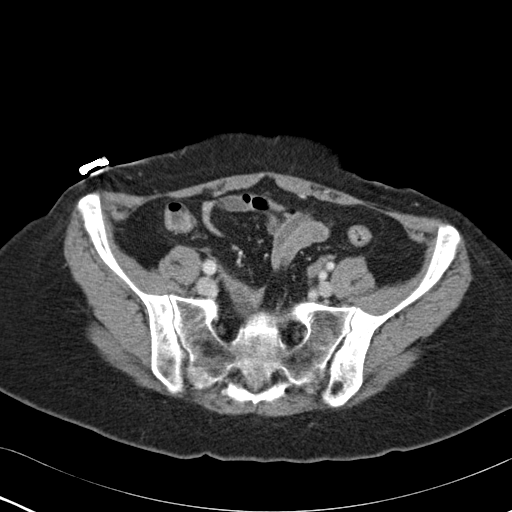
[im 40/96  soft-tissue]
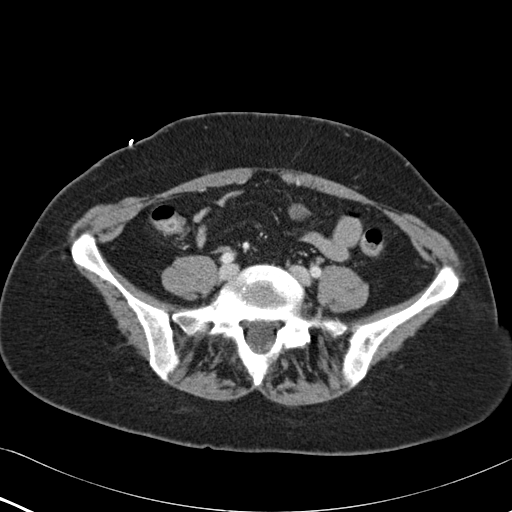
[im 51/96  soft-tissue]
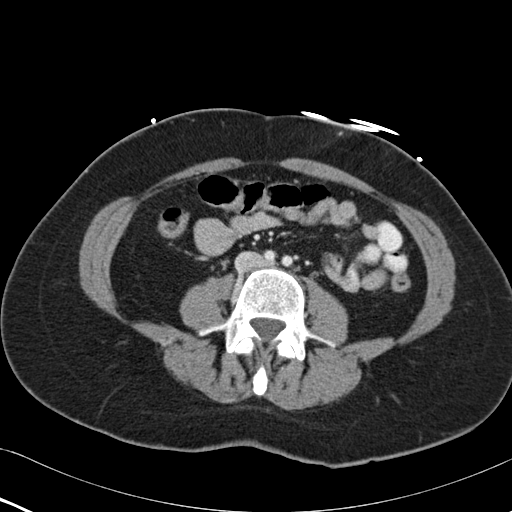
[im 56/96  soft-tissue]
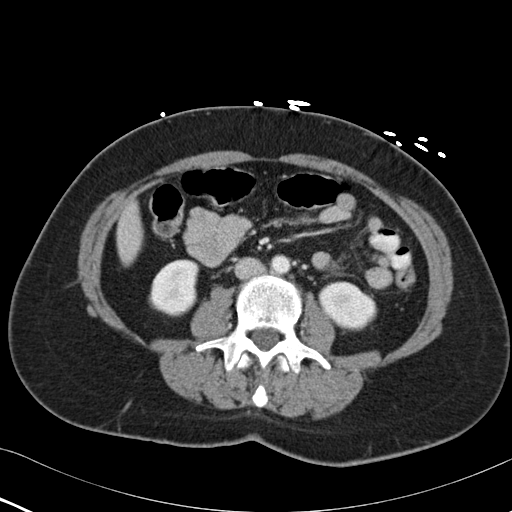
[im 62/96  soft-tissue]
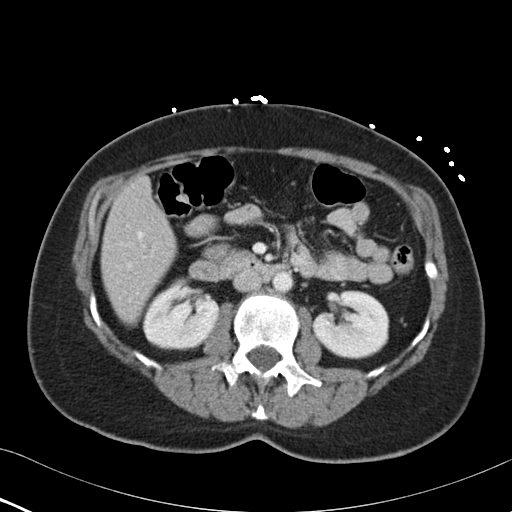
[im 62/96  bone]
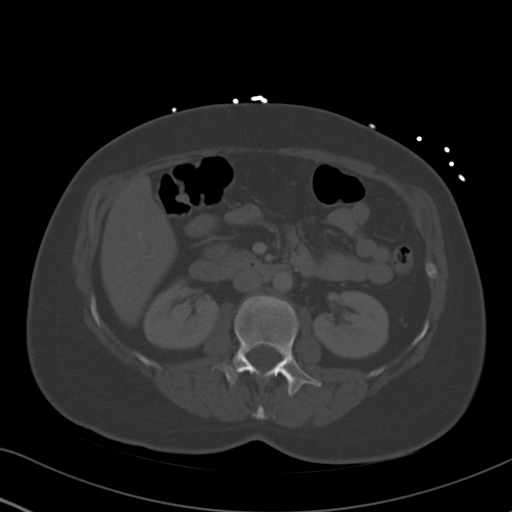
[im 68/96  soft-tissue]
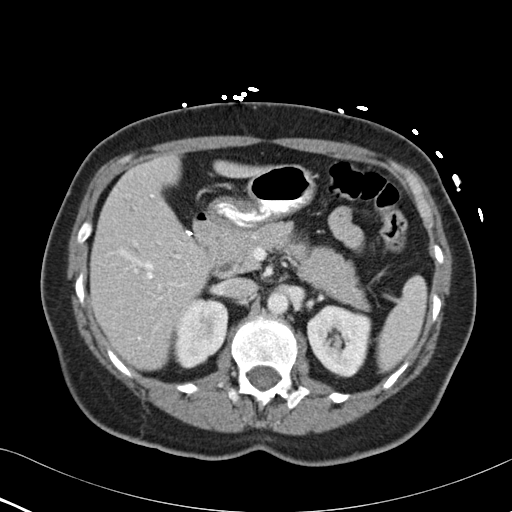
[im 73/96  soft-tissue]
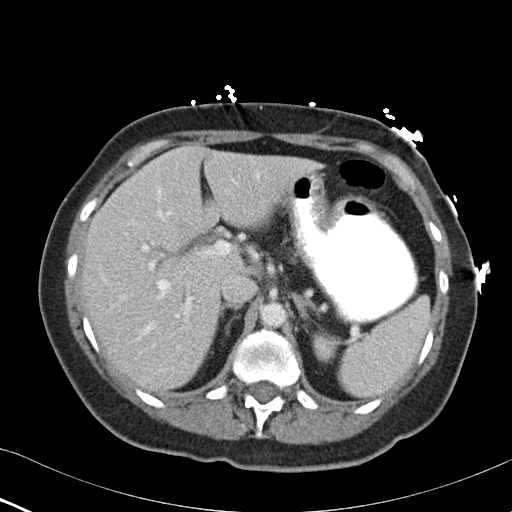
[im 84/96  soft-tissue]
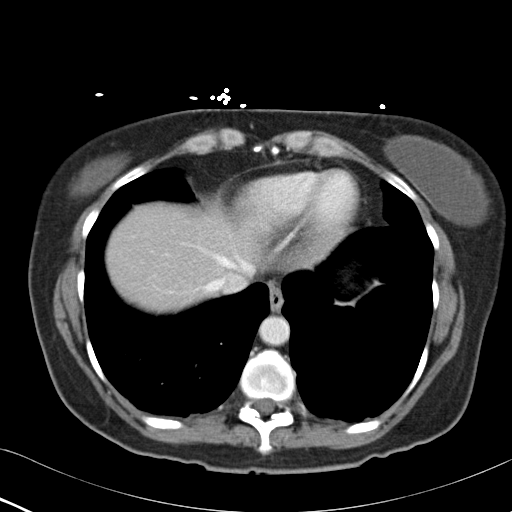
[im 90/96  soft-tissue]
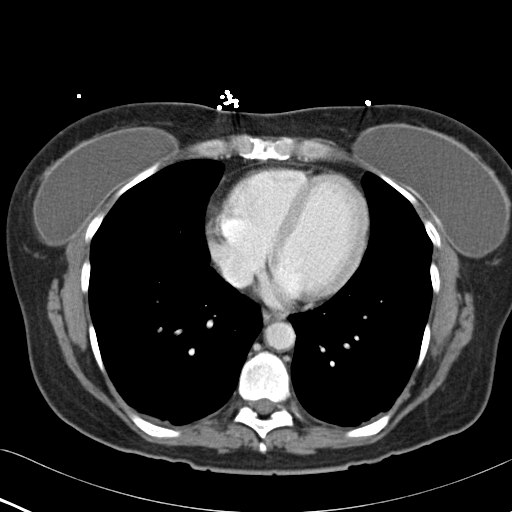

[Series 3: coronal · coronal · 0.68mm/px · 3 of 108 slices shown]
[im 36/108  soft-tissue]
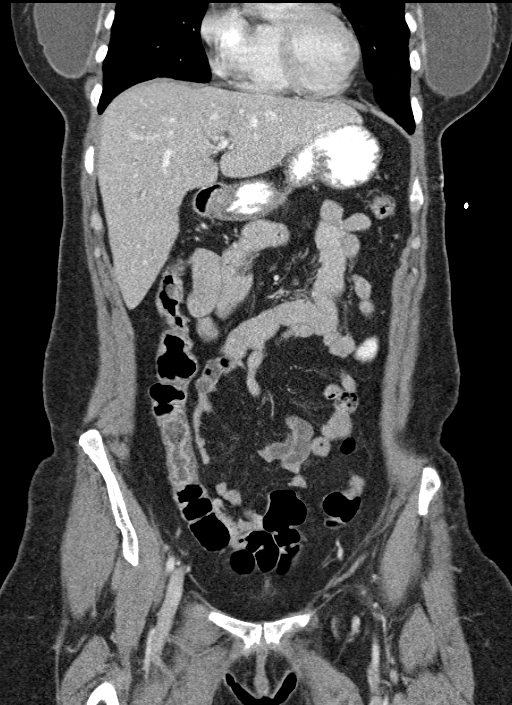
[im 48/108  soft-tissue]
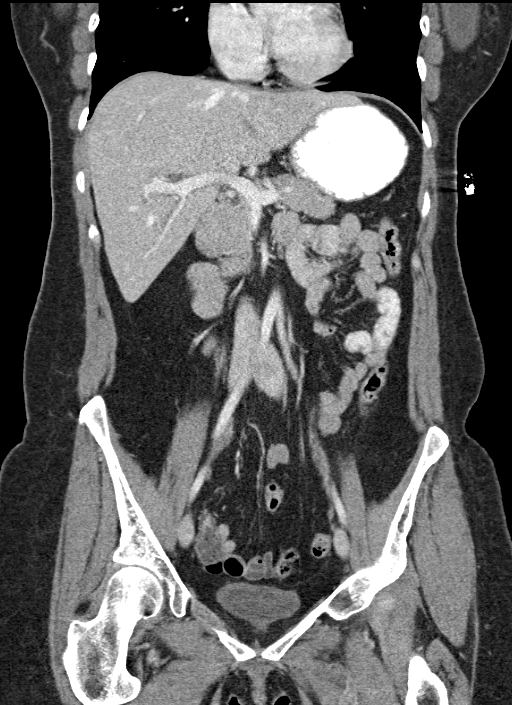
[im 60/108  soft-tissue]
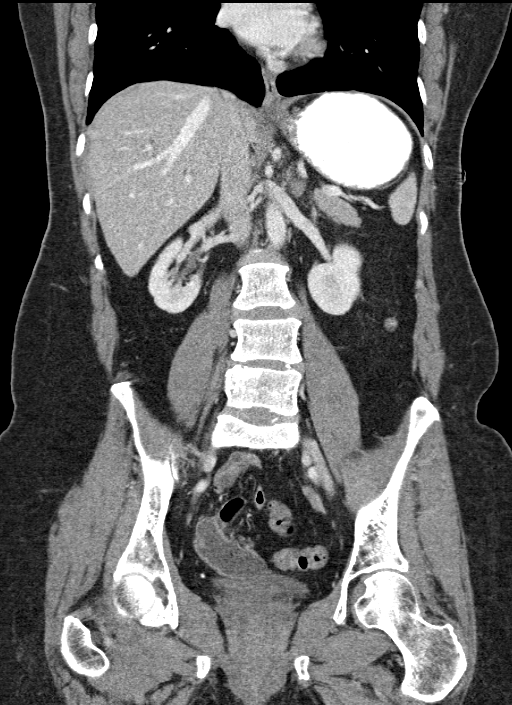

[16 of 46 positions shown; findings below may reference images not displayed]

FINDINGS: Lower chest and abdominal wall: Bilateral breast implant, negative
where visualized. Porta catheter tip seen at the distal SVC.

Hepatobiliary: No focal liver abnormality.Cholecystectomy mild
intrahepatic biliary dilatation, often reservoir effect.

Pancreas: Unremarkable.

Spleen: Unremarkable.

Adrenals/Urinary Tract: Negative adrenals. No hydronephrosis or
stone. Unremarkable bladder.

Stomach/Bowel:  No obstruction. Normal appendix.

Reproductive:Hysterectomy. Two 1 cm cysts/follicles in the right
ovary, incidental at this size.

Vascular/Lymphatic: No acute vascular abnormality. No mass or
adenopathy.

Other: No ascites or pneumoperitoneum.

Musculoskeletal: No acute abnormalities.
IMPRESSION: 1. No acute finding.
2. Mild increase in intrahepatic bile duct diameter compared to
04/17/2015. This appearance may be related to cholecystectomy;
correlate with liver function tests.

## 2017-02-19 IMAGING — CR DG CHEST 1V PORT
1 series · 1 of 1 positions shown · non-contrast
Comparison: None.

CLINICAL DATA: Chest pain

EXAM:
PORTABLE CHEST 1 VIEW

[ap]
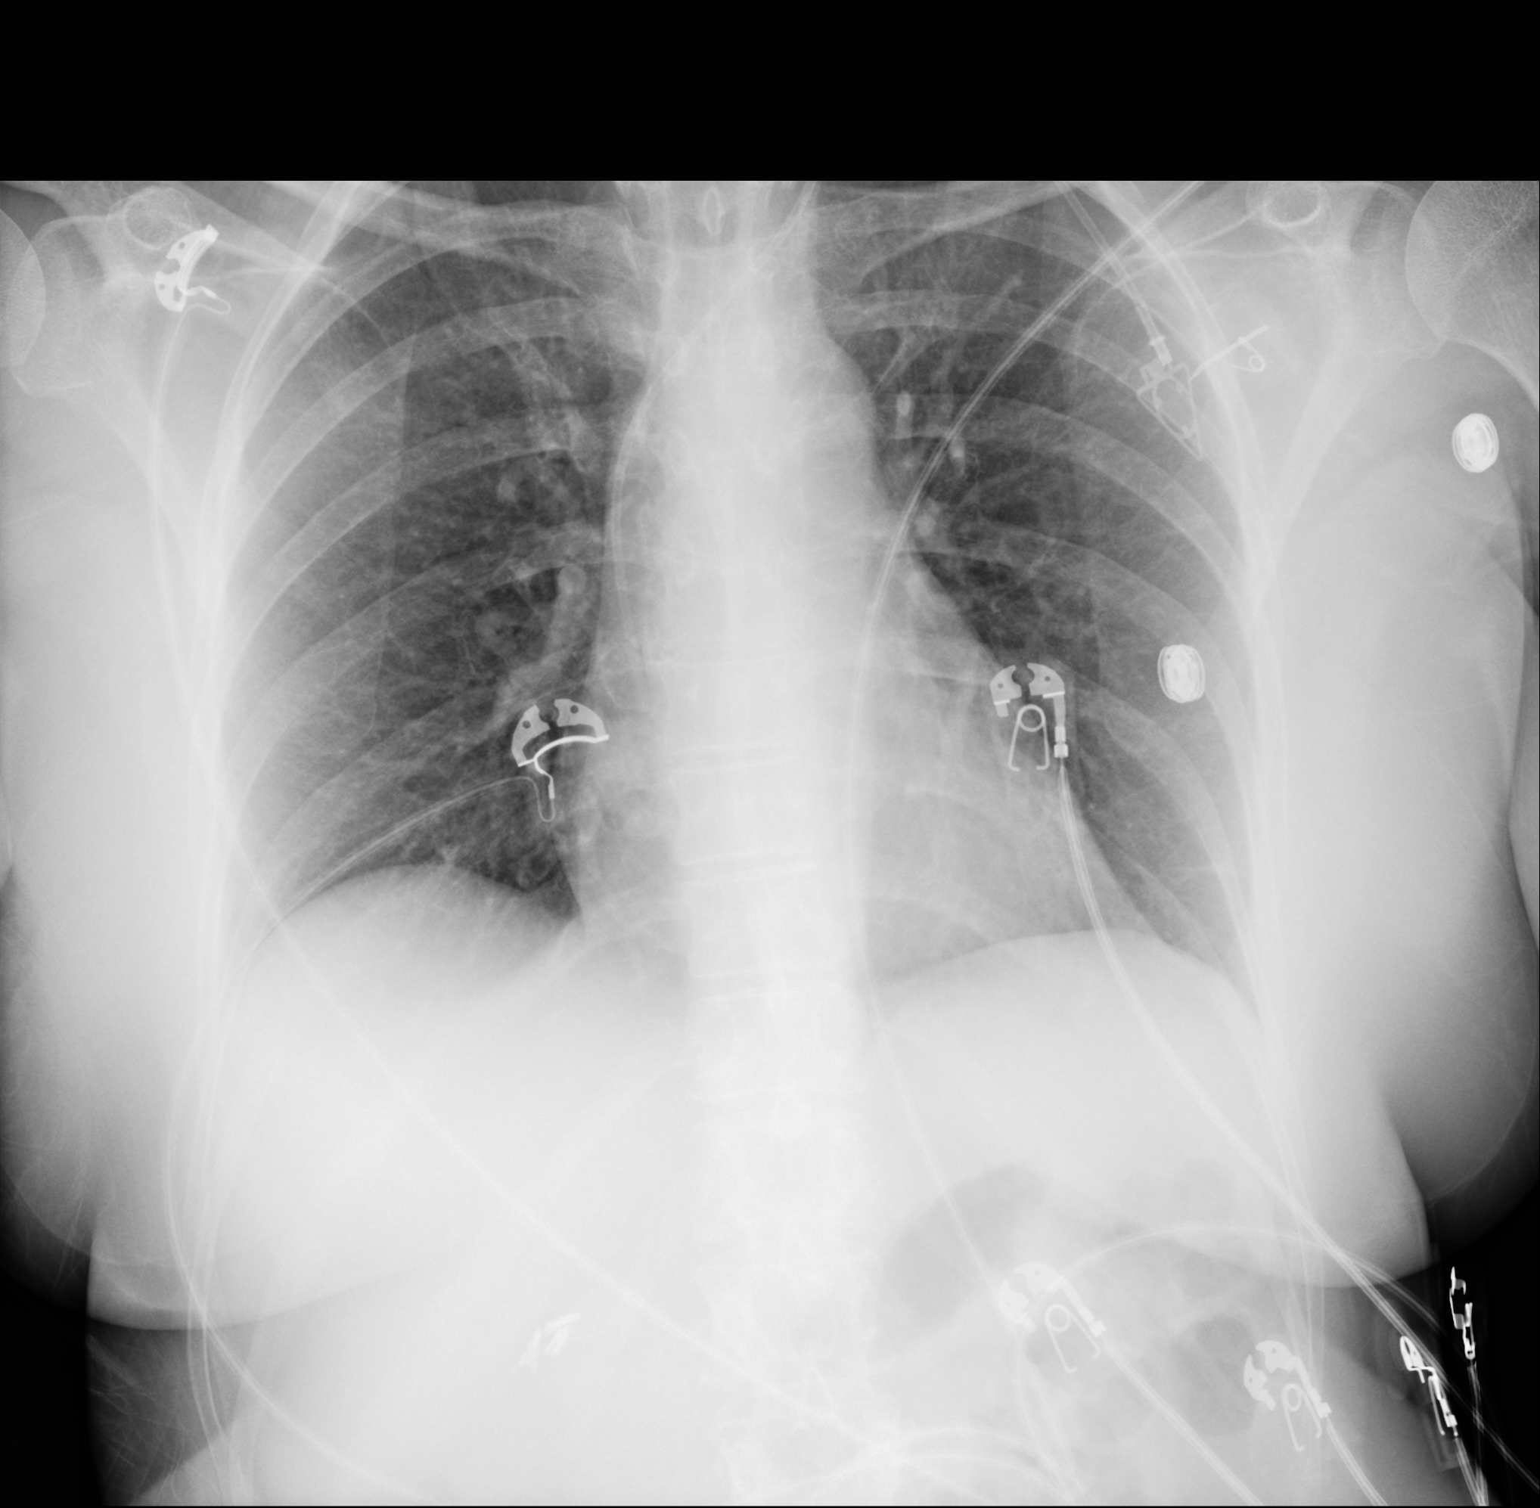

[1 of 1 positions shown; findings below may reference images not displayed]

FINDINGS: Porta catheter on the left with tip at the distal SVC.

Normal heart size and mediastinal contours. No acute infiltrate or
edema. No effusion or pneumothorax. No acute osseous findings.
Cholecystectomy clips.
IMPRESSION: No evidence of acute cardiopulmonary disease.
